# Patient Record
Sex: Female | Born: 1965 | Race: White | Hispanic: No | Marital: Married | Smoking: Never smoker
Health system: Southern US, Community
[De-identification: ages and names within clinical notes are randomized; demographics above are authoritative.]

## PROBLEM LIST (undated history)

## (undated) DIAGNOSIS — I82409 Acute embolism and thrombosis of unspecified deep veins of unspecified lower extremity: Secondary | ICD-10-CM

## (undated) DIAGNOSIS — A498 Other bacterial infections of unspecified site: Secondary | ICD-10-CM

## (undated) HISTORY — DX: Acute embolism and thrombosis of unspecified deep veins of unspecified lower extremity: I82.409

## (undated) HISTORY — PX: DILATION AND CURETTAGE OF UTERUS: SHX78

## (undated) HISTORY — DX: Other bacterial infections of unspecified site: A49.8

## (undated) HISTORY — PX: CHOLECYSTECTOMY: SHX55

---

## 2001-10-06 DIAGNOSIS — I82409 Acute embolism and thrombosis of unspecified deep veins of unspecified lower extremity: Secondary | ICD-10-CM

## 2001-10-06 HISTORY — DX: Acute embolism and thrombosis of unspecified deep veins of unspecified lower extremity: I82.409

## 2014-01-31 ENCOUNTER — Encounter (HOSPITAL_COMMUNITY): Payer: Self-pay | Admitting: Pharmacist

## 2014-02-14 MED ORDER — DEXTROSE 5 % IV SOLN
2.0000 g | INTRAVENOUS | Status: AC
  Administered 2014-02-15: 2 g via INTRAVENOUS
  Filled 2014-02-14: qty 2

## 2014-02-14 NOTE — H&P (Addendum)
  48 yo G2P1 with irregular vb and endometrial mass presents for surgical mngt  PMHx:  FVL,  PSHx:  Chole, D&C, c-section All:  Neg Meds:  ASA, MTV SHx:  Neg tobacco  AF, VSS Gen - NAD Abd - soft, NT CV - RRR Lungs - clear PV - uterus mobile, NT  US:  12 mm intracavitary polypoid mass  A/P:  Irregular menses and endometrial mass Hysteroscopy, D&C, resection of endometrial mass R/b/a discussed, questions answered, informed  constent

## 2014-02-15 ENCOUNTER — Ambulatory Visit (HOSPITAL_COMMUNITY): Payer: BC Managed Care – PPO | Admitting: Anesthesiology

## 2014-02-15 ENCOUNTER — Encounter (HOSPITAL_COMMUNITY)
Admission: RE | Disposition: A | Discharge status: Home / Self Care | Payer: Self-pay | Source: Ambulatory Visit | Attending: Obstetrics and Gynecology

## 2014-02-15 ENCOUNTER — Encounter (HOSPITAL_COMMUNITY): Payer: Self-pay | Admitting: *Deleted

## 2014-02-15 ENCOUNTER — Ambulatory Visit (HOSPITAL_COMMUNITY)
Admission: RE | Admit: 2014-02-15 | Discharge: 2014-02-15 | Disposition: A | Discharge status: Home / Self Care | Payer: BC Managed Care – PPO | Source: Ambulatory Visit | Attending: Obstetrics and Gynecology | Admitting: Obstetrics and Gynecology

## 2014-02-15 ENCOUNTER — Encounter (HOSPITAL_COMMUNITY): Payer: BC Managed Care – PPO | Admitting: Anesthesiology

## 2014-02-15 DIAGNOSIS — N840 Polyp of corpus uteri: Secondary | ICD-10-CM | POA: Insufficient documentation

## 2014-02-15 DIAGNOSIS — N926 Irregular menstruation, unspecified: Secondary | ICD-10-CM | POA: Insufficient documentation

## 2014-02-15 HISTORY — PX: HYSTEROSCOPY WITH D & C: SHX1775

## 2014-02-15 LAB — CBC
HCT: 41.8 % (ref 36.0–46.0)
HEMOGLOBIN: 14 g/dL (ref 12.0–15.0)
MCH: 31 pg (ref 26.0–34.0)
MCHC: 33.5 g/dL (ref 30.0–36.0)
MCV: 92.7 fL (ref 78.0–100.0)
Platelets: 283 10*3/uL (ref 150–400)
RBC: 4.51 MIL/uL (ref 3.87–5.11)
RDW: 11.8 % (ref 11.5–15.5)
WBC: 6 10*3/uL (ref 4.0–10.5)

## 2014-02-15 LAB — PREGNANCY, URINE: PREG TEST UR: NEGATIVE

## 2014-02-15 SURGERY — DILATATION AND CURETTAGE /HYSTEROSCOPY
Anesthesia: General | Site: Vagina

## 2014-02-15 MED ORDER — ONDANSETRON HCL 4 MG/2ML IJ SOLN
INTRAMUSCULAR | Status: DC | PRN
  Administered 2014-02-15: 4 mg via INTRAVENOUS

## 2014-02-15 MED ORDER — NEOSTIGMINE METHYLSULFATE 10 MG/10ML IV SOLN
INTRAVENOUS | Status: AC
  Filled 2014-02-15: qty 1

## 2014-02-15 MED ORDER — DEXAMETHASONE SODIUM PHOSPHATE 10 MG/ML IJ SOLN
INTRAMUSCULAR | Status: DC | PRN
  Administered 2014-02-15: 10 mg via INTRAVENOUS

## 2014-02-15 MED ORDER — FENTANYL CITRATE 0.05 MG/ML IJ SOLN
INTRAMUSCULAR | Status: AC
  Filled 2014-02-15: qty 5

## 2014-02-15 MED ORDER — MIDAZOLAM HCL 2 MG/2ML IJ SOLN
INTRAMUSCULAR | Status: AC
  Filled 2014-02-15: qty 2

## 2014-02-15 MED ORDER — ONDANSETRON HCL 4 MG/2ML IJ SOLN
INTRAMUSCULAR | Status: AC
  Filled 2014-02-15: qty 2

## 2014-02-15 MED ORDER — FENTANYL CITRATE 0.05 MG/ML IJ SOLN: 25.0000 ug | INTRAMUSCULAR | Status: DC | PRN

## 2014-02-15 MED ORDER — PROPOFOL 10 MG/ML IV EMUL
INTRAVENOUS | Status: AC
  Filled 2014-02-15: qty 20

## 2014-02-15 MED ORDER — LIDOCAINE HCL (CARDIAC) 20 MG/ML IV SOLN
INTRAVENOUS | Status: AC
  Filled 2014-02-15: qty 5

## 2014-02-15 MED ORDER — DEXAMETHASONE SODIUM PHOSPHATE 10 MG/ML IJ SOLN
INTRAMUSCULAR | Status: AC
  Filled 2014-02-15: qty 1

## 2014-02-15 MED ORDER — LIDOCAINE HCL (CARDIAC) 20 MG/ML IV SOLN
INTRAVENOUS | Status: DC | PRN
  Administered 2014-02-15: 80 mg via INTRAVENOUS

## 2014-02-15 MED ORDER — MEPERIDINE HCL 25 MG/ML IJ SOLN: 6.2500 mg | INTRAMUSCULAR | Status: DC | PRN

## 2014-02-15 MED ORDER — OXYCODONE-ACETAMINOPHEN 5-325 MG PO TABS: 1.0000 | ORAL_TABLET | ORAL | Status: DC | PRN

## 2014-02-15 MED ORDER — KETOROLAC TROMETHAMINE 30 MG/ML IJ SOLN
INTRAMUSCULAR | Status: AC
  Filled 2014-02-15: qty 1

## 2014-02-15 MED ORDER — ROCURONIUM BROMIDE 100 MG/10ML IV SOLN
INTRAVENOUS | Status: AC
  Filled 2014-02-15: qty 1

## 2014-02-15 MED ORDER — FENTANYL CITRATE 0.05 MG/ML IJ SOLN
INTRAMUSCULAR | Status: DC | PRN
  Administered 2014-02-15: 50 ug via INTRAVENOUS

## 2014-02-15 MED ORDER — METOCLOPRAMIDE HCL 5 MG/ML IJ SOLN: 10.0000 mg | Freq: Once | INTRAMUSCULAR | Status: DC | PRN

## 2014-02-15 MED ORDER — MIDAZOLAM HCL 2 MG/2ML IJ SOLN
INTRAMUSCULAR | Status: DC | PRN
  Administered 2014-02-15: 2 mg via INTRAVENOUS

## 2014-02-15 MED ORDER — PROPOFOL 10 MG/ML IV BOLUS
INTRAVENOUS | Status: DC | PRN
  Administered 2014-02-15: 150 mg via INTRAVENOUS

## 2014-02-15 MED ORDER — LIDOCAINE HCL 1 % IJ SOLN
INTRAMUSCULAR | Status: AC
  Filled 2014-02-15: qty 20

## 2014-02-15 MED ORDER — KETOROLAC TROMETHAMINE 30 MG/ML IJ SOLN: 15.0000 mg | Freq: Once | INTRAMUSCULAR | Status: DC | PRN

## 2014-02-15 MED ORDER — KETOROLAC TROMETHAMINE 30 MG/ML IJ SOLN
INTRAMUSCULAR | Status: DC | PRN
  Administered 2014-02-15: 30 mg via INTRAVENOUS

## 2014-02-15 MED ORDER — LACTATED RINGERS IV SOLN
INTRAVENOUS | Status: DC
  Administered 2014-02-15 (×2): via INTRAVENOUS

## 2014-02-15 MED ORDER — GLYCOPYRROLATE 0.2 MG/ML IJ SOLN
INTRAMUSCULAR | Status: AC
  Filled 2014-02-15: qty 3

## 2014-02-15 MED ORDER — LIDOCAINE HCL 1 % IJ SOLN
INTRAMUSCULAR | Status: DC | PRN
  Administered 2014-02-15: 10 mL

## 2014-02-15 MED ORDER — FENTANYL CITRATE 0.05 MG/ML IJ SOLN
INTRAMUSCULAR | Status: AC
  Filled 2014-02-15: qty 2

## 2014-02-15 MED ORDER — SODIUM CHLORIDE 0.9 % IR SOLN
Status: DC | PRN
  Administered 2014-02-15: 3000 mL

## 2014-02-15 SURGICAL SUPPLY — 24 items
ABLATOR ENDOMETRIAL BIPOLAR (ABLATOR) IMPLANT
CANISTER SUCT 3000ML (MISCELLANEOUS) ×2 IMPLANT
CATH ROBINSON RED A/P 16FR (CATHETERS) ×2 IMPLANT
CATH THERMACHOICE III (CATHETERS) IMPLANT
CLOTH BEACON ORANGE TIMEOUT ST (SAFETY) ×2 IMPLANT
CONTAINER PREFILL 10% NBF 60ML (FORM) ×4 IMPLANT
DRAPE HYSTEROSCOPY (DRAPE) ×2 IMPLANT
DRSG TELFA 3X8 NADH (GAUZE/BANDAGES/DRESSINGS) ×2 IMPLANT
ELECT REM PT RETURN 9FT ADLT (ELECTROSURGICAL)
ELECTRODE REM PT RTRN 9FT ADLT (ELECTROSURGICAL) IMPLANT
GLOVE BIO SURGEON STRL SZ 6.5 (GLOVE) ×2 IMPLANT
GLOVE BIOGEL PI IND STRL 7.0 (GLOVE) ×1 IMPLANT
GLOVE BIOGEL PI INDICATOR 7.0 (GLOVE) ×1
GOWN STRL REUS W/TWL LRG LVL3 (GOWN DISPOSABLE) ×4 IMPLANT
LOOP ANGLED CUTTING 22FR (CUTTING LOOP) IMPLANT
NEEDLE SPNL 22GX3.5 QUINCKE BK (NEEDLE) ×2 IMPLANT
PACK VAGINAL MINOR WOMEN LF (CUSTOM PROCEDURE TRAY) ×2 IMPLANT
PAD OB MATERNITY 4.3X12.25 (PERSONAL CARE ITEMS) ×2 IMPLANT
SEAL ROD LENS SCOPE MYOSURE (ABLATOR) ×2 IMPLANT
SET TUBING HYSTEROSCOPY 2 NDL (TUBING) ×2 IMPLANT
SYR CONTROL 10ML LL (SYRINGE) ×2 IMPLANT
TOWEL OR 17X24 6PK STRL BLUE (TOWEL DISPOSABLE) ×4 IMPLANT
TUBE HYSTEROSCOPY W Y-CONNECT (TUBING) ×2 IMPLANT
WATER STERILE IRR 1000ML POUR (IV SOLUTION) ×2 IMPLANT

## 2014-02-15 NOTE — Anesthesia Preprocedure Evaluation (Addendum)
Anesthesia Evaluation  Patient identified by MRN, date of birth, ID band Patient awake    Reviewed: Allergy & Precautions, H&P , NPO status , Patient's Chart, lab work & pertinent test results, reviewed documented beta blocker date and time   History of Anesthesia Complications Negative for: history of anesthetic complications  Airway Mallampati: I TM Distance: >3 FB Neck ROM: full    Dental  (+) Teeth Intact   Pulmonary neg pulmonary ROS,  breath sounds clear to auscultation  Pulmonary exam normal       Cardiovascular Exercise Tolerance: Good negative cardio ROS  Rhythm:regular Rate:Normal     Neuro/Psych negative neurological ROS  negative psych ROS   GI/Hepatic negative GI ROS, Neg liver ROS,   Endo/Other  negative endocrine ROS  Renal/GU negative Renal ROS  Female GU complaint     Musculoskeletal   Abdominal   Peds  Hematology negative hematology ROS (+)   Anesthesia Other Findings   Reproductive/Obstetrics negative OB ROS                           Anesthesia Physical Anesthesia Plan  ASA: I  Anesthesia Plan: General LMA   Post-op Pain Management:    Induction:   Airway Management Planned:   Additional Equipment:   Intra-op Plan:   Post-operative Plan:   Informed Consent: I have reviewed the patients History and Physical, chart, labs and discussed the procedure including the risks, benefits and alternatives for the proposed anesthesia with the patient or authorized representative who has indicated his/her understanding and acceptance.   Dental Advisory Given  Plan Discussed with: CRNA and Surgeon  Anesthesia Plan Comments:         Anesthesia Quick Evaluation  

## 2014-02-15 NOTE — Transfer of Care (Signed)
Immediate Anesthesia Transfer of Care Note  Patient: Adriana Norris  Procedure(s) Performed: Procedure(s): DILATATION AND CURETTAGE /HYSTEROSCOPY  (N/A)  Patient Location: PACU  Anesthesia Type:MAC  Level of Consciousness: awake, alert  and oriented  Airway & Oxygen Therapy: Patient Spontanous Breathing and Patient connected to nasal cannula oxygen  Post-op Assessment: Report given to PACU RN and Post -op Vital signs reviewed and stable  Post vital signs: Reviewed and stable  Complications: No apparent anesthesia complications

## 2014-02-15 NOTE — Discharge Instructions (Signed)
[facility] office 2-3 weeks for postop appointment.  Call the office [phone] for an appointment. ° °Personal Hygiene: °Use pads not tampons x 1week °You may shower, no tub baths or pools for 2-3 weeks °Wipe from front to back when using restroom ° °Activity: °Do not drive or operate any equipment for 24 hrs.   °Do not rest in bed all day °Walking is encouraged °Walk up and down stairs slowly °You may return to your normal activity in 1-2 days ° °Sexual Activity:  No intercourse for 2 weeks after the procedure. ° °Diet: Eat a light meal as desired this evening.  You may resume your usual diet tomorrow. ° °Return to work:  You may resume your work activities after 1-2 days ° °What to expect:  Expect to have vaginal bleeding/discharge for 2-3 days and spotting for 10-14 days.  It is not unusual to have soreness for 1-2 weeks.  You may have a slight burning sensation when you urinate for the first few days.  You may start your menses in 2-6 weeks.  Mild cramps may continue for a couple of days.   ° °Call your doctor:   °Excessive bleeding, saturating a pad every hour °Inability to urinate 6 hours after discharge °Pain not relieved with pain medications °Fever of 100.4 or greater ° ° °Post Anesthesia Home Care Instructions ° °Activity: °Get plenty of rest for the remainder of the day. A responsible adult should stay with you for 24 hours following the procedure.  °For the next 24 hours, DO NOT: °-Drive a car °-Operate machinery °-Drink alcoholic beverages °-Take any medication unless instructed by your physician °-Make any legal decisions or sign important papers. ° °Meals: °Start with liquid foods such as gelatin or soup. Progress to regular foods as tolerated. Avoid greasy, spicy, heavy foods. If nausea and/or vomiting occur, drink only clear liquids until the nausea and/or vomiting subsides. Call your physician if vomiting continues. ° °Special Instructions/Symptoms: °Your throat may feel dry or sore from the anesthesia or  the breathing tube placed in your throat during surgery. If this causes discomfort, gargle with warm salt water. The discomfort should disappear within 24 hours. ° °

## 2014-02-15 NOTE — Anesthesia Procedure Notes (Signed)
Procedure Name: LMA Insertion Date/Time: 02/15/2014 1:09 PM Performed by: Dailon Sheeran, Jannet AskewHARLESETTA Norris Pre-anesthesia Checklist: Patient identified, Timeout performed, Emergency Drugs available, Suction available and Patient being monitored Patient Re-evaluated:Patient Re-evaluated prior to inductionOxygen Delivery Method: Circle system utilized Preoxygenation: Pre-oxygenation with 100% oxygen Intubation Type: IV induction Ventilation: Mask ventilation without difficulty LMA: LMA inserted LMA Size: 4.0 Number of attempts: 1 ETT to lip (cm): at lips. Dental Injury: Teeth and Oropharynx as per pre-operative assessment

## 2014-02-15 NOTE — Anesthesia Postprocedure Evaluation (Signed)
  Anesthesia Post-op Note  Anesthesia Post Note  Patient: Adriana Norris  Procedure(s) Performed: Procedure(s) (LRB): DILATATION AND CURETTAGE /HYSTEROSCOPY  (N/A)  Anesthesia type: General  Patient location: PACU  Post pain: Pain level controlled  Post assessment: Post-op Vital signs reviewed  Last Vitals:  Filed Vitals:   02/15/14 1500  BP: 124/82  Pulse: 64  Temp: 36.3 C  Resp: 16    Post vital signs: Reviewed  Level of consciousness: sedated  Complications: No apparent anesthesia complications

## 2014-02-16 ENCOUNTER — Encounter (HOSPITAL_COMMUNITY): Payer: Self-pay | Admitting: Obstetrics and Gynecology

## 2014-02-16 NOTE — Op Note (Signed)
NAMMadelin Headings:  Adriana Norris, Adriana Norris             ACCOUNT NO.:  [id number]  MEDICAL RECORD NO.:  [id number]  LOCATION:  [facility]                          FACILITY:  [facility]  PHYSICIAN:  Zelphia CairoGretchen Samiha Denapoli, MD    DATE OF BIRTH:  1966/08/26  DATE OF PROCEDURE: DATE OF DISCHARGE:  02/15/2014                              OPERATIVE REPORT   PREOPERATIVE DIAGNOSES: 1. Endometrial bleeding. 2. Endometrial mass, suspect polyp.  POSTOPERATIVE DIAGNOSES: 1. Endometrial bleeding. 2. Endometrial mass, suspect polyp.  PROCEDURE: 1. Cervical block. 2. Hysteroscopy. 3. Dilation and curettage with removal of endometrial polyp.  SURGEON:  Zelphia CairoGretchen Jolyne Laye, MD  ANESTHESIA:  General and local.  SPECIMEN:  Endometrial curettings.  COMPLICATIONS:  None.  CONDITION:  Stable to recovery room.  DESCRIPTION OF PROCEDURE:  The patient was taken to the operating room, where anesthesia was found to be adequate.  She was placed in the dorsal lithotomy position using Allen stirrups and prepped and draped in sterile fashion.  An in- and out catheter was used to drain her bladder. Bivalve speculum was placed in the vagina.  A 1 mL of 1% lidocaine was injected at the 12 o'clock position of the cervix.  Single-tooth tenaculum was attached to the anterior lip of the cervix.  A 9 mL of lidocaine was then used to perform a cervical block.  The cervix was serially dilated with Shawnie PonsPratt dilators.  Hysteroscope was inserted and a fluffy endometrial lining was noted.  No dominant polypoid masses were noted.  A gentle curetting was then performed and a polypoid mass was removed from the uterine wall, placed on Telfa, and passed off with the endometrial curettings to be sent for pathology.  Another curetting was performed throughout the cavity.  Specimen was placed on Telfa.  She tolerated the procedure well.  Tenaculum was removed from the cervix.  The cervix was hemostatic.  Speculum was removed.  She was extubated and taken to the  recovery room in stable condition.  Sponge, lap, needle, and instrument counts were correct x2.     Zelphia CairoGretchen Safira Proffit, MD     GA/MEDQ  D:  02/15/2014  T:  02/16/2014  Job:  [id number]

## 2014-02-24 ENCOUNTER — Encounter: Payer: Self-pay | Admitting: Family Medicine

## 2014-02-24 ENCOUNTER — Ambulatory Visit (HOSPITAL_BASED_OUTPATIENT_CLINIC_OR_DEPARTMENT_OTHER): Payer: BC Managed Care – PPO

## 2014-02-24 ENCOUNTER — Ambulatory Visit (HOSPITAL_BASED_OUTPATIENT_CLINIC_OR_DEPARTMENT_OTHER)
Admission: RE | Admit: 2014-02-24 | Discharge: 2014-02-24 | Disposition: A | Discharge status: Home / Self Care | Payer: BC Managed Care – PPO | Source: Ambulatory Visit | Attending: Family Medicine | Admitting: Family Medicine

## 2014-02-24 ENCOUNTER — Ambulatory Visit (INDEPENDENT_AMBULATORY_CARE_PROVIDER_SITE_OTHER): Payer: BC Managed Care – PPO | Admitting: Family Medicine

## 2014-02-24 ENCOUNTER — Telehealth: Payer: Self-pay | Admitting: Family Medicine

## 2014-02-24 VITALS — BP 108/72 | HR 81 | Temp 99.7°F | Resp 18 | Ht 67.0 in | Wt 119.0 lb

## 2014-02-24 DIAGNOSIS — R0789 Other chest pain: Secondary | ICD-10-CM

## 2014-02-24 DIAGNOSIS — R7989 Other specified abnormal findings of blood chemistry: Secondary | ICD-10-CM

## 2014-02-24 DIAGNOSIS — D6851 Activated protein C resistance: Secondary | ICD-10-CM

## 2014-02-24 DIAGNOSIS — D6859 Other primary thrombophilia: Secondary | ICD-10-CM

## 2014-02-24 DIAGNOSIS — Z86718 Personal history of other venous thrombosis and embolism: Secondary | ICD-10-CM

## 2014-02-24 DIAGNOSIS — R911 Solitary pulmonary nodule: Secondary | ICD-10-CM | POA: Insufficient documentation

## 2014-02-24 DIAGNOSIS — A084 Viral intestinal infection, unspecified: Secondary | ICD-10-CM

## 2014-02-24 DIAGNOSIS — A088 Other specified intestinal infections: Secondary | ICD-10-CM

## 2014-02-24 DIAGNOSIS — R791 Abnormal coagulation profile: Secondary | ICD-10-CM

## 2014-02-24 DIAGNOSIS — R079 Chest pain, unspecified: Secondary | ICD-10-CM | POA: Insufficient documentation

## 2014-02-24 LAB — D-DIMER, QUANTITATIVE: D-Dimer, Quant: 1.64 ug/mL-FEU — ABNORMAL HIGH (ref 0.00–0.48)

## 2014-02-24 MED ORDER — IOHEXOL 350 MG/ML SOLN
100.0000 mL | Freq: Once | INTRAVENOUS | Status: AC | PRN
  Administered 2014-02-24: 100 mL via INTRAVENOUS

## 2014-02-24 NOTE — Progress Notes (Signed)
Pre visit review using our clinic review tool, if applicable. No additional management support is needed unless otherwise documented below in the visit note. 

## 2014-02-24 NOTE — Progress Notes (Addendum)
Office Note 02/24/14  CC:  Chief Complaint  Patient presents with  . Diarrhea    since Monday  . chest pressure    started Wednesday evening    HPI:  Adriana Norris is a 48 y.o. White female who is here to establish care, has some complaints to discuss. Patient's most recent primary MD: none.  She sees Dr. Renaldo FiddlerAdkins for GYN care. Old records in EP[facility] EMR were reviewed prior to or during today's visit.  About 5d/a started having frequent loose stools, a bit of queeziness, no vomiting, has been doing bland diet.  Lower abd crampy pain, improving lately.  No fevers.  Tried some imodium yesterday and has not had BM since, also pepto multiple times since illness started.  Two days ago she began feeling pressure in chest, hurts worse bending over and lying supine, worse with deep breath, worse going up steps, has mild cough that started yesterday.  No distinct GER.  SOB when went up stairs too fast.  No wheezing.  No chest pain.  No diaphoresis, palpitations, left arm or jaw pain, or resting SOB.  Past Medical History  Diagnosis Date  . DVT (deep venous thrombosis) 2003    in context of broken toe, relative immobility, also on OCPs.  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    Past Surgical History  Procedure Laterality Date  . Cholecystectomy      2011  . Dilation and curettage of uterus      2005 (miscarriage)  . Cesarean section      2007  . Hysteroscopy w/d&c N/A 02/15/2014    w/removal of endometrial polyp: Procedure: DILATATION AND CURETTAGE /HYSTEROSCOPY ;  Surgeon: Zelphia CairoGretchen Adkins, MD;  Location: [facility];  Service: Gynecology;  Laterality: N/A;    Family History  Problem Relation Age of Onset  . Arthritis Mother   . Stroke Maternal Grandmother   . Diabetes Maternal Grandfather   . Stroke Maternal Grandfather   . Cancer Paternal Grandfather     History   Social History  . Marital Status: Married    Spouse Name: N/A    Number of Children: N/A  . Years of  Education: N/A   Occupational History  . Not on file.   Social History Main Topics  . Smoking status: Never Smoker   . Smokeless tobacco: Never Used  . Alcohol Use: No  . Drug Use: No  . Sexual Activity: Yes   Other Topics Concern  . Not on file   Social History Narrative   Married, 1 son.   [place] [place] [address], relocated from [place] 2014.   Occupation: stay at home mom.   College: [employer]   No tob, occ alc.  No hx of drugs.   MEDS: none  No Known Allergies  ROS Review of Systems  Constitutional: Negative for fever, chills, appetite change and fatigue.  HENT: Negative for congestion, dental problem, ear pain and sore throat.   Eyes: Negative for discharge, redness and visual disturbance.  Respiratory: Positive for shortness of breath (as per hpi). Negative for cough, chest tightness and wheezing.   Cardiovascular: Positive for chest pain. Negative for palpitations and leg swelling.  Gastrointestinal: Positive for nausea, abdominal pain and diarrhea. Negative for vomiting and blood in stool.  Genitourinary: Negative for dysuria, urgency, frequency, hematuria, flank pain and difficulty urinating.  Musculoskeletal: Negative for arthralgias, back pain, joint swelling, myalgias and neck stiffness.  Skin: Negative for pallor and rash.  Neurological: Negative for dizziness,  speech difficulty, weakness and headaches.  Hematological: Negative for adenopathy. Does not bruise/bleed easily.  Psychiatric/Behavioral: Negative for confusion and sleep disturbance. The patient is not nervous/anxious.     PE; Blood pressure 108/72, pulse 81, temperature 99.7 F (37.6 C), temperature source Temporal, resp. rate 18, height 5\' 7"  (1.702 m), weight 119 lb (53.978 kg), SpO2 100.00%. Examined with CMA April Miller as chaperone. Gen: Alert, well appearing.  Patient is oriented to person, place, time, and situation. ENT: Ears: EACs clear, normal epithelium.  TMs with good light reflex and  landmarks bilaterally.  Eyes: no injection, icteris, swelling, or exudate.  EOMI, PERRLA. Nose: no drainage or turbinate edema/swelling.  No injection or focal lesion.  Mouth: lips without lesion/swelling.  Oral mucosa pink and moist.  Dentition intact and without obvious caries or gingival swelling.  Oropharynx without erythema, exudate, or swelling.  Neck - No masses or thyromegaly or limitation in range of motion CV: RRR, no m/r/g.  Chest wall without tenderness.  When she lay supine on exam she said her chest pressure got worse. LUNGS: CTA bilat, nonlabored resps, good aeration in all lung fields. ABD: soft, ND, mild suprapubic TTP, no mass/bruit/HSM. EXT: no clubbing, cyanosis, or edema.  No tenderness.  Pertinent labs:  none  ASSESSMENT AND PLAN:   New pt: no old records to obtain.  1) Viral Gastroenteritis, resolving. Continue symptomatic care prn, advance diet as tolerated.  2) Chest pressure: with her history of DVT and Factor V Leiden carrier state, plus lack of other convincing symptoms to classify this as bronchitis, will check D dimer and CXR. If d dimer elevated will proceed with CT angio chest to r/o PE. With current GI illness, her chest could simply be feeling this way from GER/esoph irritation, so I started her on dexilant 60mg  qd (samples x 10d given today).  An After Visit Summary was printed and given to the patient.  Return for to be determined based on w/u results.

## 2014-02-24 NOTE — Telephone Encounter (Signed)
Patient has decided not to do the chest xray. She would like to try the medicine first.

## 2014-03-06 LAB — CLOSTRIDIUM DIFFICILE BY PCR

## 2014-03-08 ENCOUNTER — Encounter: Payer: Self-pay | Admitting: *Deleted

## 2014-03-08 ENCOUNTER — Telehealth: Payer: Self-pay | Admitting: Gastroenterology

## 2014-03-08 ENCOUNTER — Encounter: Payer: Self-pay | Admitting: Gastroenterology

## 2014-03-08 NOTE — Telephone Encounter (Signed)
Spoke with patient and explained she is scheduled by a referral from [facility] and she needs to talk with them. She will do this and see if she really needs to come to OV.

## 2014-03-09 ENCOUNTER — Telehealth: Payer: Self-pay | Admitting: Gastroenterology

## 2014-03-10 ENCOUNTER — Telehealth: Payer: Self-pay | Admitting: Gastroenterology

## 2014-03-10 NOTE — Telephone Encounter (Signed)
Switchboard is closed for incoming calls. Will try again later.

## 2014-03-10 NOTE — Telephone Encounter (Signed)
Unable to reach. Switch board closed for incoming calls.

## 2014-03-10 NOTE — Telephone Encounter (Signed)
Spoke with Clydie Braun and she is will talk with Dr. Vickey Sages about patient's OV that is scheduled and if patient needs it.

## 2014-03-14 NOTE — Telephone Encounter (Signed)
Left a message with Adriana Norris at [facility] to call me. Adriana Norris is off this week)

## 2014-03-15 ENCOUNTER — Ambulatory Visit: Payer: BC Managed Care – PPO | Admitting: Gastroenterology

## 2014-03-21 ENCOUNTER — Encounter: Payer: Self-pay | Admitting: Nurse Practitioner

## 2014-03-21 ENCOUNTER — Ambulatory Visit (INDEPENDENT_AMBULATORY_CARE_PROVIDER_SITE_OTHER): Payer: BC Managed Care – PPO | Admitting: Nurse Practitioner

## 2014-03-21 VITALS — BP 110/64 | HR 76 | Ht 67.0 in | Wt 119.0 lb

## 2014-03-21 DIAGNOSIS — A0472 Enterocolitis due to Clostridium difficile, not specified as recurrent: Secondary | ICD-10-CM

## 2014-03-21 NOTE — Telephone Encounter (Signed)
Patient came for OV. 

## 2014-03-21 NOTE — Patient Instructions (Signed)
We will put a recall letter in our system to remind you to call us in March 2016 to schedule a colonoscopy.

## 2014-03-22 ENCOUNTER — Encounter: Payer: Self-pay | Admitting: Nurse Practitioner

## 2014-03-22 DIAGNOSIS — A0472 Enterocolitis due to Clostridium difficile, not specified as recurrent: Secondary | ICD-10-CM | POA: Insufficient documentation

## 2014-03-22 NOTE — Progress Notes (Signed)
i agree with the above plan 

## 2014-03-22 NOTE — Progress Notes (Signed)
    HPI :  Patient is a 48 year old female referred by her gynecologist for a recent episode of C. difficile infection. Patient recently underwent hysteroscopy, D&C with resection of an endometrial polyp (pathology was benign) . A few days following the procedure patient developed explosive diarrhea. She does recall receiving perioperative antibiotics . Gynecologist ordered stool for C. difficile, it was positive. Patient completed 10 days of metronidazole (last dose 3 days ago). Her stools are nearly back to baseline, she feels much better. Patient has no chronic gastrointestinal complaints. She is status post cholecystectomy 4 years ago. No family history of colon cancer .  Past Medical History  Diagnosis Date  . DVT (deep venous thrombosis) 2003    in context of broken toe, relative immobility, also on OCPs.  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  . Clostridium difficile infection    Family History  Problem Relation Age of Onset  . Arthritis Mother   . Stroke Maternal Grandmother   . Diabetes Maternal Grandfather   . Stroke Maternal Grandfather   . Cancer Paternal Grandfather    History  Substance Use Topics  . Smoking status: Never Smoker   . Smokeless tobacco: Never Used  . Alcohol Use: No   Current Outpatient Prescriptions  Medication Sig Dispense Refill  . acetaminophen (TYLENOL) 500 MG tablet Take 1,000 mg by mouth every 6 (six) hours as needed for mild pain or headache.      Marland Kitchen. aspirin 81 MG tablet Take 81 mg by mouth 3 (three) times a week. Mon, Wed, Fri      . Multiple Vitamins-Minerals (MULTIVITAMIN WITH MINERALS) tablet Take 1 tablet by mouth 3 (three) times a week. Mon, Wed, Fri       No current facility-administered medications for this visit.   No Known Allergies   Review of Systems: All systems reviewed and negative except where noted in HPI.   Physical Exam: BP 110/64  Pulse 76  Ht 5\' 7"  (1.702 m)  Wt 119 lb (53.978 kg)  BMI 18.63  kg/m2 Constitutional: Pleasant,well-developed, white female in no acute distress. HEENT: Normocephalic and atraumatic. Conjunctivae are normal. No scleral icterus. Neck supple.  Cardiovascular: Normal rate, regular rhythm.  Pulmonary/chest: Effort normal and breath sounds normal. No wheezing, rales or rhonchi. Abdominal: Soft, nondistended, nontender. Bowel sounds active throughout. There are no masses palpable. No hepatomegaly. Extremities: no edema Lymphadenopathy: No cervical adenopathy noted. Neurological: Alert and oriented to person place and time. Skin: Skin is warm and dry. No rashes noted. Psychiatric: Normal mood and affect. Behavior is normal.   ASSESSMENT AND PLAN:   pleasant 48 year old female with a recent C. difficile infection. Patient had perioperative antibiotics 4 days prior to the onset of diarrhea. Feeling much better since completion of a ten-day course of metronidazole. Patient will call us if she has recurrent diarrheal and/or any other gastrointestinal problems. Otherwise, advised her to contact our office around March 2016 to schedule a colonoscopy for colon cancer screening

## 2014-03-23 ENCOUNTER — Telehealth: Payer: Self-pay | Admitting: Nurse Practitioner

## 2014-03-23 MED ORDER — METRONIDAZOLE 250 MG PO TABS: ORAL_TABLET | ORAL | Status: DC

## 2014-03-23 MED ORDER — ONDANSETRON HCL 4 MG PO TABS: ORAL_TABLET | ORAL | Status: AC

## 2014-03-23 NOTE — Telephone Encounter (Signed)
Per Willette ClusterPaula Guenther, NP , Flagyl 250 mg TID x 2 weeks, Zofran 4 mg po every 8 hours prn nausea. Rx's sent to pharmacy. Patient notified.

## 2014-03-23 NOTE — Telephone Encounter (Signed)
Spoke with Willette ClusterPaula Guenther, NP,and she gave patient the Flagyl 250 mg because she had nausea with 500 mg. If she wants to try to take 500 mg that is ok. Spoke with patient and she states she told Willette ClusterPaula Guenther, NP she had nausea but did not vomit and she does not think Willette ClusterPaula Guenther, NP "listened to me." Patient will try Flagyl 500 mg over the weekend and will call with update on Monday. She is taking Florastor also.

## 2014-03-23 NOTE — Telephone Encounter (Signed)
Patient calling to report loose stool x 3-4 last night and this morning multiple diarrhea stools and gas pains. She has been off Flagyl since 03/18/14.Please, advise.

## 2014-03-24 NOTE — Telephone Encounter (Signed)
Spoke with patient to see how she is doing this AM and to reassure her Willette ClusterPaula Guenther, NP has reviewed all results from her GYN. Patient is doing well. States it was about 4 days before she felt the nausea last time. She will call me Monday with update on how she is doing.

## 2014-03-27 ENCOUNTER — Encounter: Payer: Self-pay | Admitting: Nurse Practitioner

## 2014-03-27 ENCOUNTER — Telehealth: Payer: Self-pay | Admitting: Nurse Practitioner

## 2014-03-27 MED ORDER — NYSTATIN 100000 UNIT/ML MT SUSP: OROMUCOSAL | Status: AC

## 2014-03-27 NOTE — Telephone Encounter (Signed)
Spoke with patient and she states over the weekend she has a stiff neck, tingling in lower legs and white thrush on tongue. She called the on call MD and stopped Flagyl on Saturday at 1:30 PM. She has not had diarrhea since then. She had a normal stool this AM. She does report a white membrane on stools. She states she does not want to feel like she did over the weekend with the Flagyl and is quiet anxious about taking another antibiotic. Please, advise.

## 2014-03-27 NOTE — Telephone Encounter (Signed)
Spoke with Willette ClusterPaula Guenther, NP and recommendations are: call if diarrhea stools return and we will do stool study to see if c. Diff has returned, for thrush-Nystatin swish and swallow QID x 1 week or Diflucan 100 mg po x 7 days. Left a message for patient to call me.

## 2014-03-27 NOTE — Telephone Encounter (Signed)
Spoke with patient and reviewed dosing of Nystatin.

## 2014-03-27 NOTE — Telephone Encounter (Signed)
Patient given recommendations. She prefers Nystatin. Rx sent.

## 2014-03-31 NOTE — Telephone Encounter (Signed)
error 

## 2014-04-04 ENCOUNTER — Telehealth: Payer: Self-pay | Admitting: Nurse Practitioner

## 2014-04-04 ENCOUNTER — Telehealth: Payer: Self-pay | Admitting: *Deleted

## 2014-04-04 MED ORDER — FLUCONAZOLE 100 MG PO TABS: ORAL_TABLET | ORAL | Status: DC

## 2014-04-04 NOTE — Telephone Encounter (Signed)
Adriana Norris, since Nystatin didn't work please give her Diflucan 200mg  today followed by 100mg  daily for an additional 6 days. We can extend treatment longer if needed by would like to try one weeks worth for now.  Thanks,  Gunnar FusiPaula

## 2014-04-04 NOTE — Telephone Encounter (Signed)
Received a warning for possible allergy interaction due to allergy to Flagyl. Per Willette ClusterPaula Guenther, NP , may try Mycelex one tablet 5 times daily x 14 days or she may try Diflucan if she wants. Spoke with patient and gave her recommendations. Explained that the Diflucan gets a warning because of her allergy to Flagyl. Patient does not want to take medication 5 times/day. She wants to try Diflucan. Rx sent.

## 2014-04-04 NOTE — Telephone Encounter (Signed)
Patient calling to report Nystatin is not helping her thrush. Please, advise.

## 2014-04-04 NOTE — Telephone Encounter (Signed)
Patient called back and states she does not want the Diflucan. She states she needs to know why the warning comes up with Diflucan and how it is similar to Flagyl. States "It is not my responsibility to know how they are similar. I am going to call some pharmacists." Attempted to explain that the warning is due to the allergy to Flagyl and they are similar drugs. She states she will talk with the pharmacists and her PCP and she does not want the Diflucan. Called and cancelled the rx for Diflucan at [facility].

## 2014-07-12 IMAGING — CT CT ANGIO CHEST
2 of 7 series · 18 of 36 positions shown · IV contrast (APPLIED)
Comparison: None.

CLINICAL DATA: Two day history of chest pain and pressure, positive
D-dimer

EXAM:
CT ANGIOGRAPHY CHEST WITH CONTRAST
TECHNIQUE: Multidetector CT imaging of the chest was performed using the
standard protocol during bolus administration of intravenous
contrast. Multiplanar CT image reconstructions and MIPs were
obtained to evaluate the vascular anatomy.
CONTRAST:  100mL OMNIPAQUE IOHEXOL 350 MG/ML SOLN

[Series 5: pe 1.0 b26f · axial · 0.65mm/px · z∈[-299,-23]mm · 17 of 308 slices shown]
[im 16/308  lung]
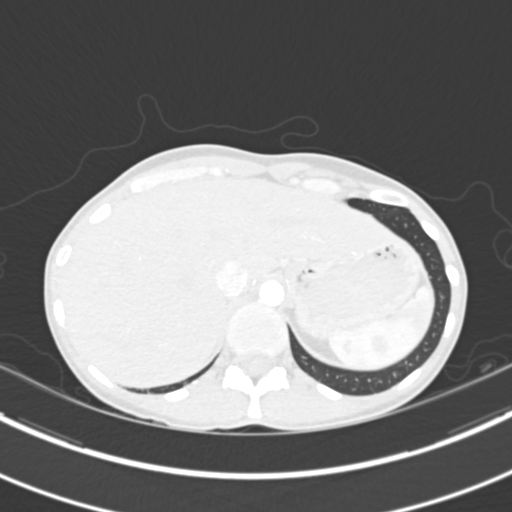
[im 31/308  mediastinal]
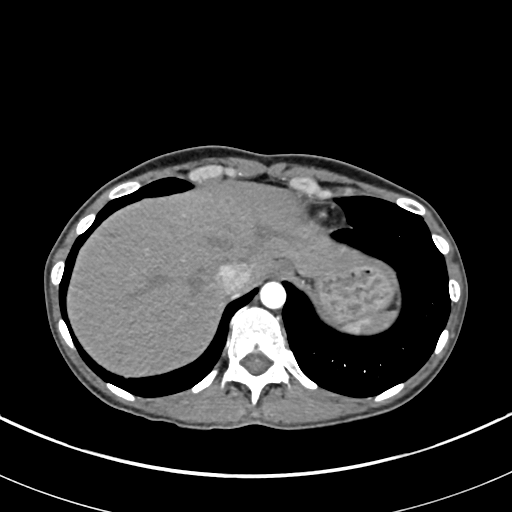
[im 47/308  lung]
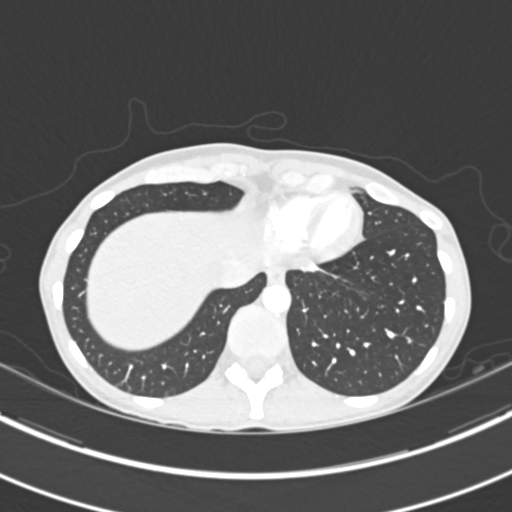
[im 62/308  mediastinal]
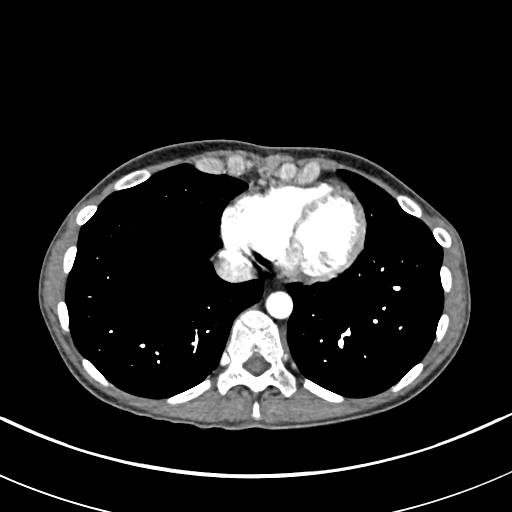
[im 93/308  lung]
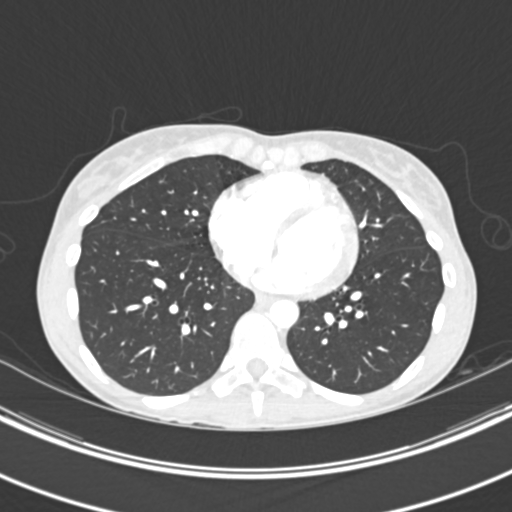
[im 108/308  mediastinal]
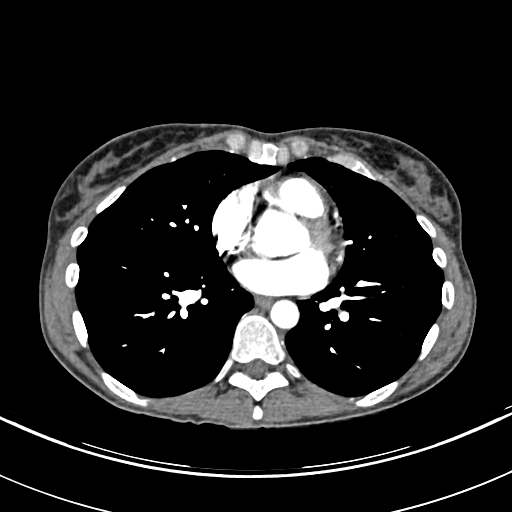
[im 123/308  lung]
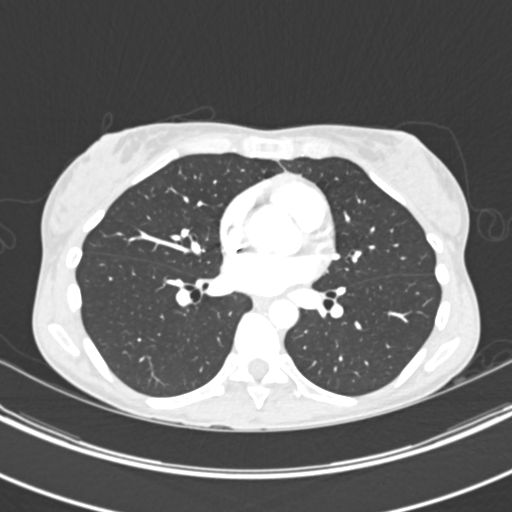
[im 139/308  mediastinal]
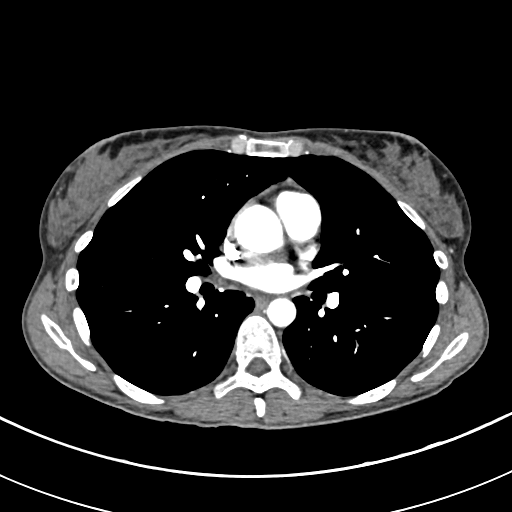
[im 154/308  lung]
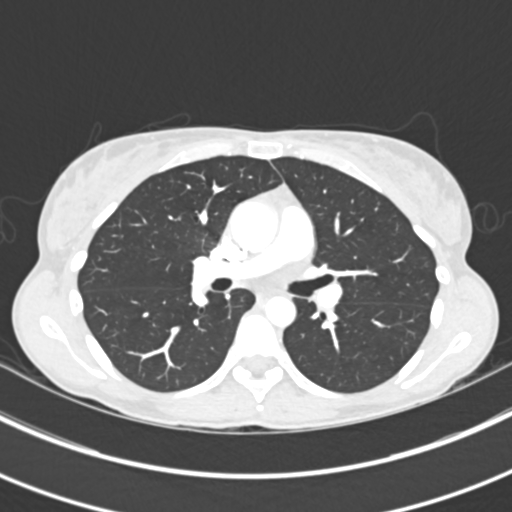
[im 169/308  mediastinal]
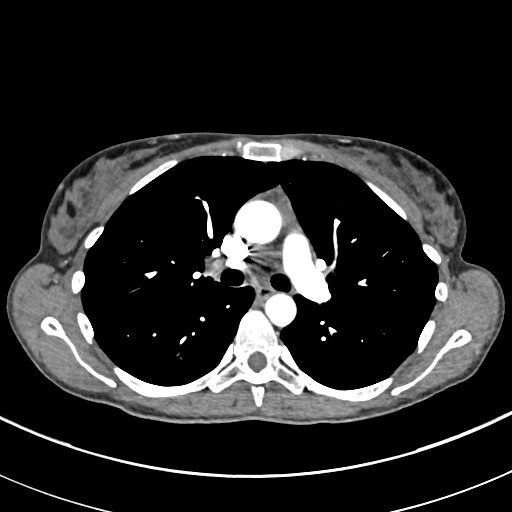
[im 185/308  lung]
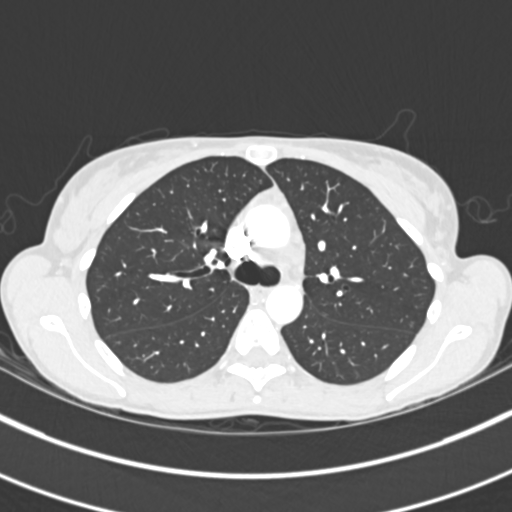
[im 200/308  mediastinal]
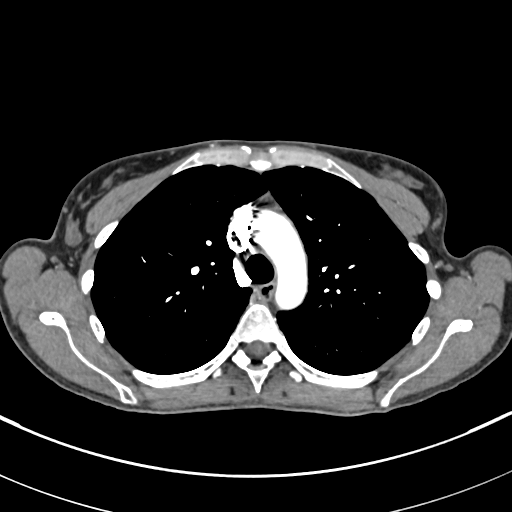
[im 215/308  lung]
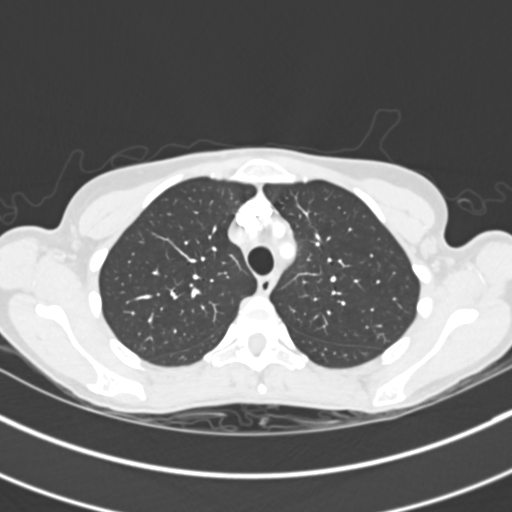
[im 246/308  mediastinal]
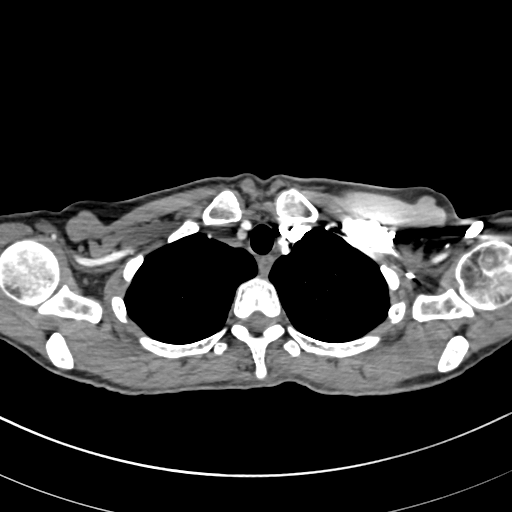
[im 261/308  lung]
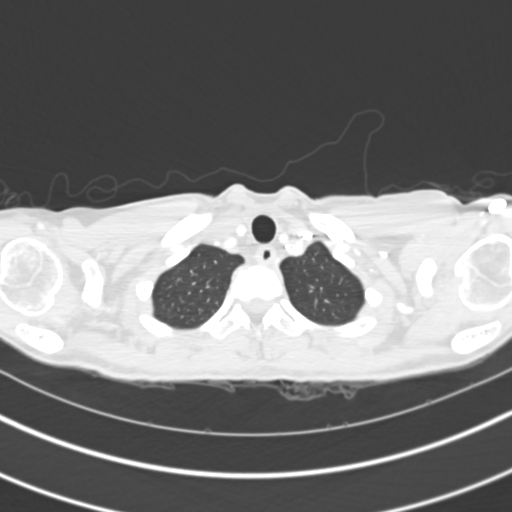
[im 277/308  mediastinal]
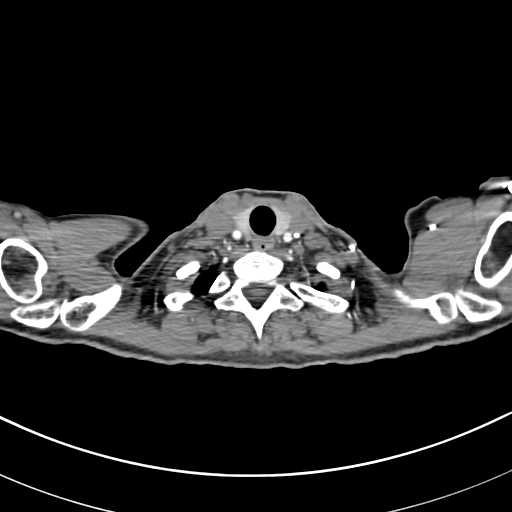
[im 292/308  lung]
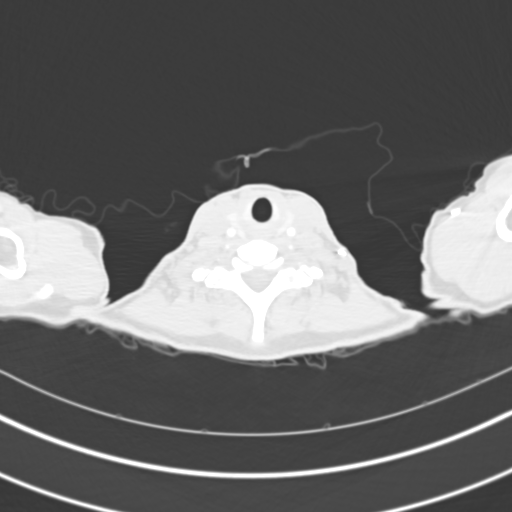

[Series 8: pe 2.0 coronal · coronal · 0.70mm/px · 1 of 90 slices shown]
[im 45/90  mediastinal]
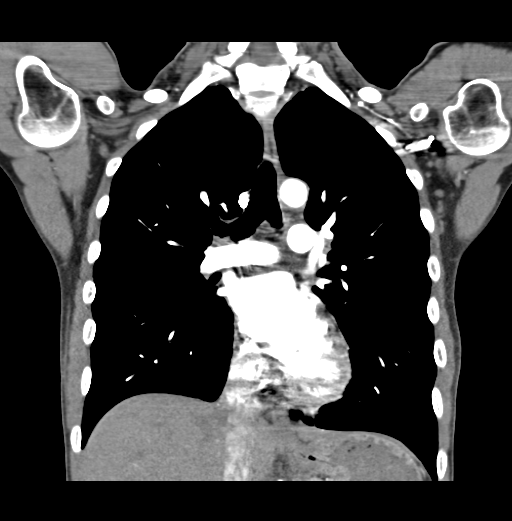

[18 of 36 positions shown; findings below may reference images not displayed]

FINDINGS: Mediastinum: Unremarkable CT appearance of the thyroid gland. No
suspicious mediastinal or hilar adenopathy. No soft tissue
mediastinal mass. Trace residual thymic tissue anteriorly. The
thoracic esophagus is unremarkable.

Heart/Vascular: Adequate opacification of the pulmonary arteries to
the proximal subsegmental level. No central filling defect to
suggest acute PE. No linear webs or vessel wall thickening to
suggest chronic PE. Conventional 3 vessel arch anatomy. The
visualized portions of the great vessels are unremarkable. No
aneurysmal dilatation or dissection. The heart is within normal
limits for size. No pericardial effusion.

Lungs/Pleura: No pleural effusion. A 3 mm nonspecific pulmonary
nodule is identified in the inferior periphery of the right lower
lobe. Tiny 2 mm subpleural lymph node in the periphery of the left
lower lobe.

Bones/Soft Tissues: No acute fracture or aggressive appearing lytic
or blastic osseous lesion.

Upper Abdomen: Visualized upper abdominal organs are unremarkable.

Review of the MIP images confirms the above findings.
IMPRESSION: 1. Negative for acute pulmonary embolus, pneumonia or other acute
cardiopulmonary process.
2. Nonspecific 3 mm right lower lobe pulmonary nodule. If the
patient is at high risk for bronchogenic carcinoma, follow-up chest
CT at 1 year is recommended. If the patient is at low risk, no
follow-up is needed. This recommendation follows the consensus
statement: Guidelines for Management of Small Pulmonary Nodules
Detected on CT Scans: A Statement from the [HOSPITAL] as

## 2014-12-06 ENCOUNTER — Encounter: Payer: Self-pay | Admitting: Gastroenterology
